# Patient Record
Sex: Male | Born: 1985 | Race: White | Hispanic: No | Marital: Married | Smoking: Never smoker
Health system: Southern US, Community
[De-identification: ages and names within clinical notes are randomized; demographics above are authoritative.]

---
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
## 2005-02-09 ENCOUNTER — Emergency Department (HOSPITAL_COMMUNITY): Admission: EM | Admit: 2005-02-09 | Discharge: 2005-02-09 | Payer: Self-pay | Admitting: Emergency Medicine

## 2007-08-15 ENCOUNTER — Emergency Department (HOSPITAL_COMMUNITY): Admission: EM | Admit: 2007-08-15 | Discharge: 2007-08-16 | Payer: Self-pay | Admitting: Emergency Medicine

## 2008-07-25 ENCOUNTER — Emergency Department (HOSPITAL_COMMUNITY): Admission: EM | Admit: 2008-07-25 | Discharge: 2008-07-26 | Payer: Self-pay | Admitting: Emergency Medicine

## 2010-01-27 ENCOUNTER — Emergency Department (HOSPITAL_COMMUNITY)
Admission: EM | Admit: 2010-01-27 | Discharge: 2010-01-27 | Payer: Self-pay | Source: Home / Self Care | Admitting: Emergency Medicine

## 2010-04-21 LAB — DIFFERENTIAL
Basophils Relative: 1 % (ref 0–1)
Lymphs Abs: 1.5 10*3/uL (ref 0.7–4.0)
Monocytes Relative: 8 % (ref 3–12)
Neutro Abs: 6.6 10*3/uL (ref 1.7–7.7)

## 2010-04-21 LAB — CBC
HCT: 44.9 % (ref 39.0–52.0)
Hemoglobin: 15.4 g/dL (ref 13.0–17.0)
MCV: 87.3 fL (ref 78.0–100.0)
Platelets: 244 10*3/uL (ref 150–400)
RDW: 12.7 % (ref 11.5–15.5)

## 2010-04-21 LAB — POCT CARDIAC MARKERS
CKMB, poc: <1 ng/mL — ABNORMAL LOW (ref 1.0–8.0)
Myoglobin, poc: 63.2 ng/mL (ref 12–200)
Troponin i, poc: <0.05 ng/mL (ref 0.00–0.09)

## 2012-07-10 ENCOUNTER — Emergency Department (HOSPITAL_COMMUNITY)
Admission: EM | Admit: 2012-07-10 | Discharge: 2012-07-10 | Disposition: A | Discharge status: Home / Self Care | Payer: Self-pay | Attending: Emergency Medicine | Admitting: Emergency Medicine

## 2012-07-10 ENCOUNTER — Encounter (HOSPITAL_COMMUNITY): Payer: Self-pay | Admitting: *Deleted

## 2012-07-10 DIAGNOSIS — Z4802 Encounter for removal of sutures: Secondary | ICD-10-CM | POA: Insufficient documentation

## 2012-07-10 NOTE — ED Notes (Signed)
Pt presents to er for removal of stitches to left ear that was placed last sunday

## 2012-07-10 NOTE — ED Notes (Signed)
7 sutures removed from left ear. Pt tolerated well. Edges well approximated. No drainage noted from site. EDP aware.

## 2012-07-12 NOTE — ED Provider Notes (Signed)
   History    CSN: [id number] Arrival date & time 07/10/12  1229  First MD Initiated Contact with Patient 07/10/12 1310     Chief Complaint  Patient presents with  . Suture / Staple Removal   (Consider location/radiation/quality/duration/timing/severity/associated sxs/prior Treatment) HPI Comments: Jordan Duarte is a 27 y.o. male who presents to the Emergency Department requesting suture removal.  States he had sutures placed to the left external ear 7 days prior.  He denies pain , swelling or drainage.  Sutures were placed at another facility.    The history is provided by the patient.   History reviewed. No pertinent past medical history. History reviewed. No pertinent past surgical history. No family history on file. History  Substance Use Topics  . Smoking status: Not on file  . Smokeless tobacco: Not on file  . Alcohol Use: Not on file    Review of Systems  Constitutional: Negative for fever and chills.  Musculoskeletal: Negative for back pain, joint swelling and arthralgias.  Skin: Positive for wound.       Laceration   Neurological: Negative for dizziness, weakness and numbness.  Hematological: Does not bruise/bleed easily.  All other systems reviewed and are negative.    Allergies  Review of patient's allergies indicates no known allergies.  Home Medications   Current Outpatient Rx  Name  Route  Sig  Dispense  Refill  . acetaminophen (TYLENOL) 500 MG tablet   Oral   Take 1,000 mg by mouth every 6 (six) hours as needed for pain.         . cephALEXin (KEFLEX) 500 MG capsule   Oral   Take 500 mg by mouth 4 (four) times daily. Started 07/04/2012.         Marland Kitchen HYDROcodone-acetaminophen (NORCO/VICODIN) 5-325 MG per tablet   Oral   Take 1 tablet by mouth every 6 (six) hours as needed for pain.          BP 125/71  Pulse 79  Temp(Src) 98.6 F (37 C) (Oral)  Resp 16  Ht 5\' 9"  (1.753 m)  Wt 200 lb (90.719 kg)  BMI 29.52 kg/m2 Physical Exam  Nursing  note and vitals reviewed. Constitutional: He is oriented to person, place, and time. He appears well-developed and well-nourished. No distress.  HENT:  Head: Normocephalic and atraumatic.  Ears:  Sutures placed to the external left ear.  Suture line intact.  Appears to be healing well.    Neck: Normal range of motion. Neck supple.  Cardiovascular: Normal rate, regular rhythm and normal heart sounds.   Pulmonary/Chest: Breath sounds normal. No respiratory distress.  Musculoskeletal: He exhibits no edema and no tenderness.  Lymphadenopathy:    He has no cervical adenopathy.  Neurological: He is alert and oriented to person, place, and time. He exhibits normal muscle tone. Coordination normal.  Skin: Skin is warm. Laceration noted.  See HENT exam    ED Course  Procedures (including critical care time) Labs Reviewed - No data to display No results found. 1. Visit for suture removal     MDM     Sutures to left external ear.   No edema or drainage.  Appears to healing well.     Sutures removed by nursing.  Suture line remains intact after removal of the sutures.    Drexel Ivey L. Trisha Mangle, PA-C 07/12/12 1733

## 2012-07-15 NOTE — ED Provider Notes (Signed)
Medical screening examination/treatment/procedure(s) were performed by non-physician practitioner and as supervising physician I was immediately available for consultation/collaboration.   Able Malloy L Summer Mccolgan, MD 07/15/12 1344 

## 2012-07-17 IMAGING — CT CT HEAD W/O CM
1 series · 16 of 30 positions shown, 20 images · non-contrast
Comparison: None.

CLINICAL DATA: Headache, weakness, nausea

CT HEAD WITHOUT CONTRAST
TECHNIQUE: Contiguous axial images were obtained from the base of
the skull through the vertex without contrast.

[Series 2: headseq 4.8 h37s · axial · 0.43mm/px · z∈[+133,+290]mm · 16 of 36 slices shown, 20 images]
[im 2/36  brain]
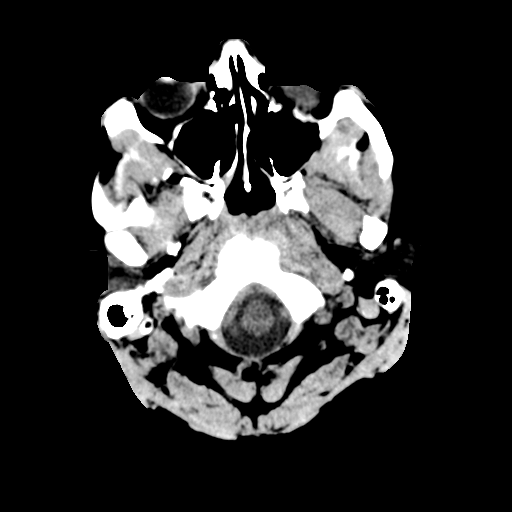
[im 2/36  bone]
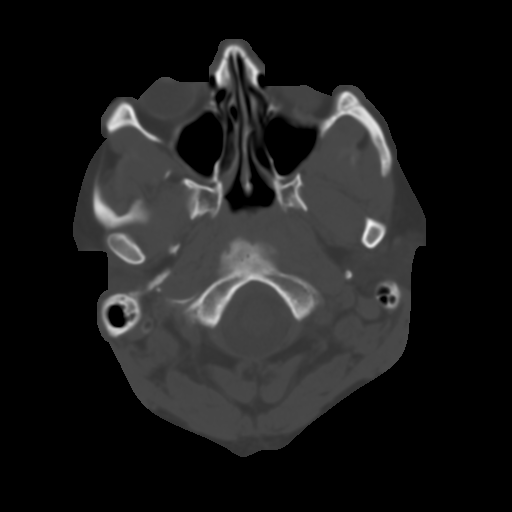
[im 4/36  brain]
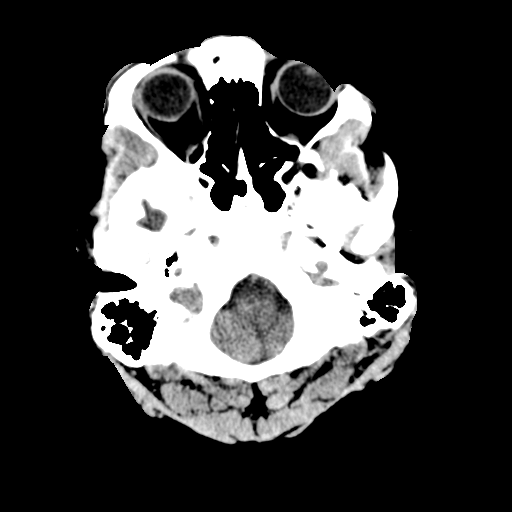
[im 7/36  brain]
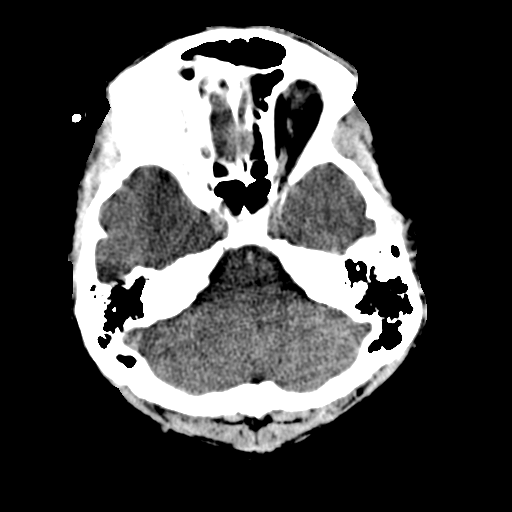
[im 9/36  brain]
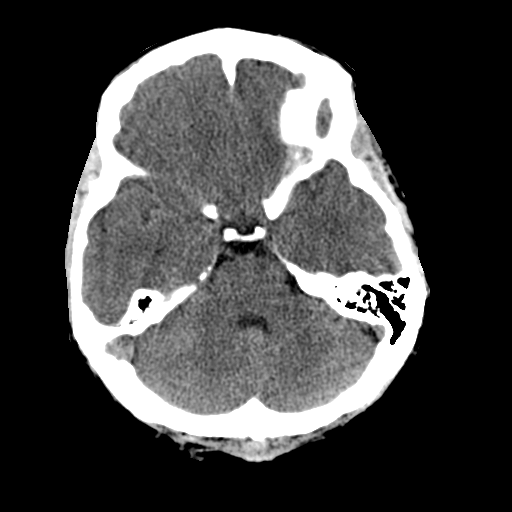
[im 10/36  brain]
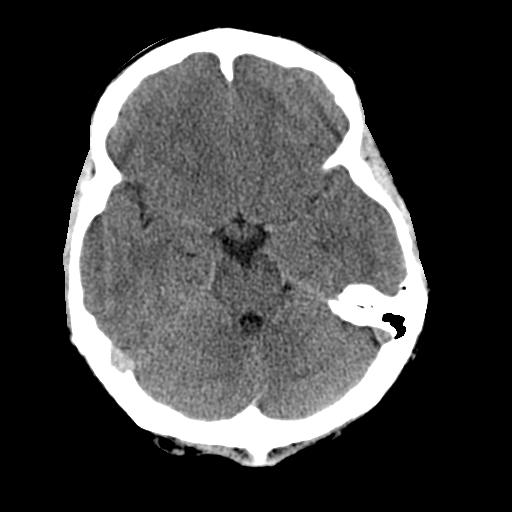
[im 10/36  bone]
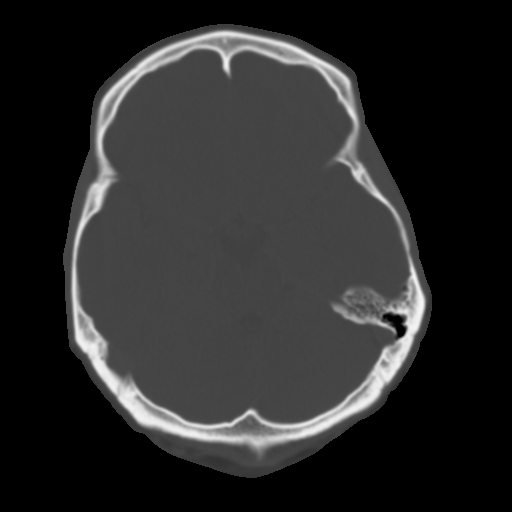
[im 13/36  brain]
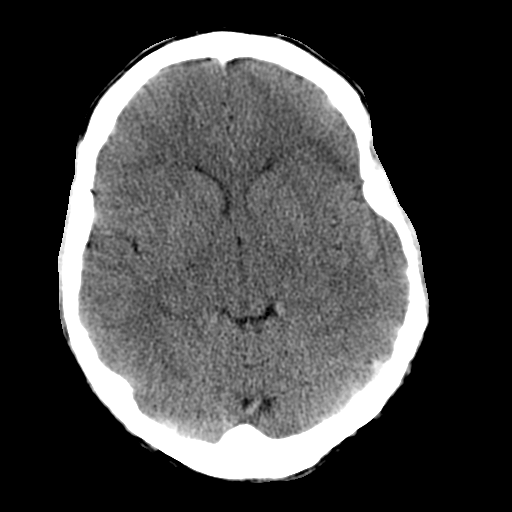
[im 15/36  brain]
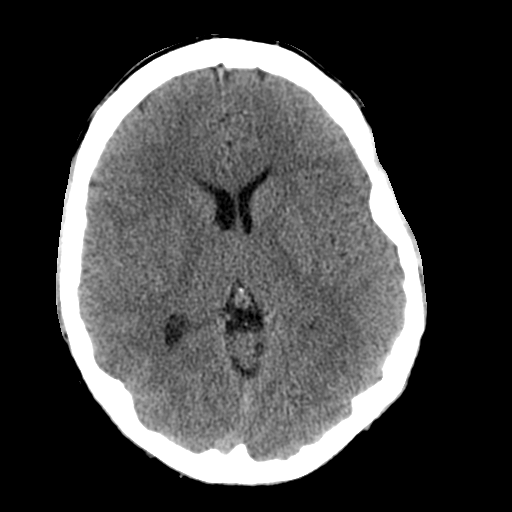
[im 17/36  brain]
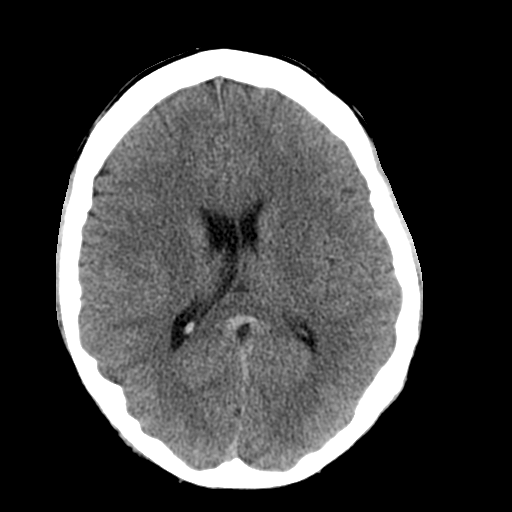
[im 19/36  brain]
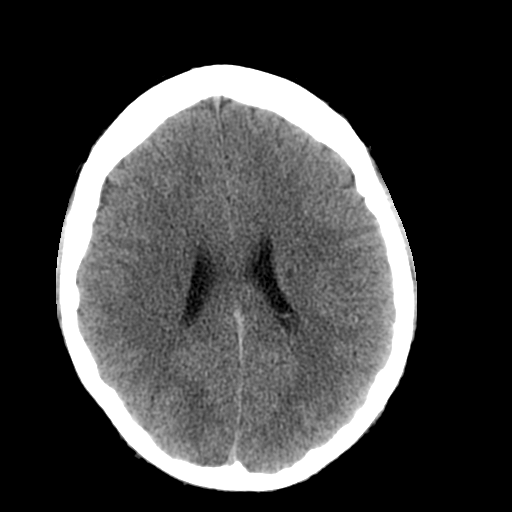
[im 19/36  bone]
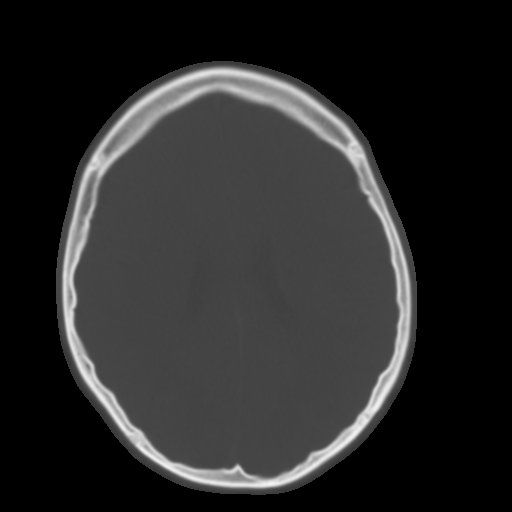
[im 21/36  brain]
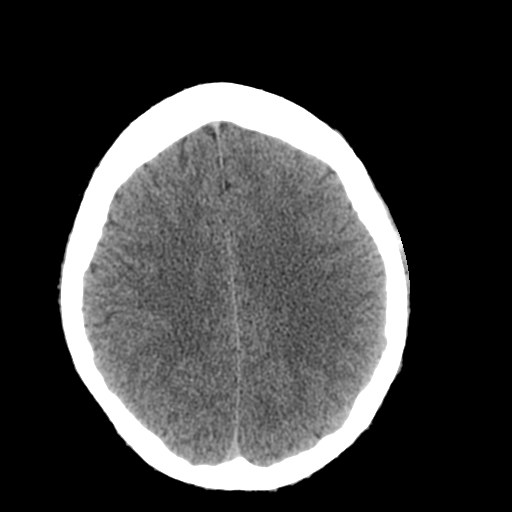
[im 23/36  brain]
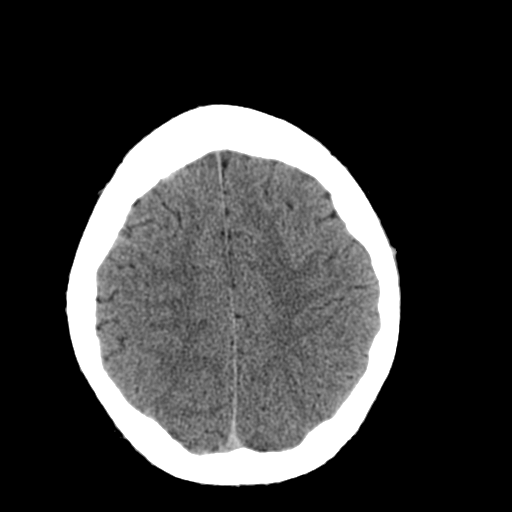
[im 26/36  brain]
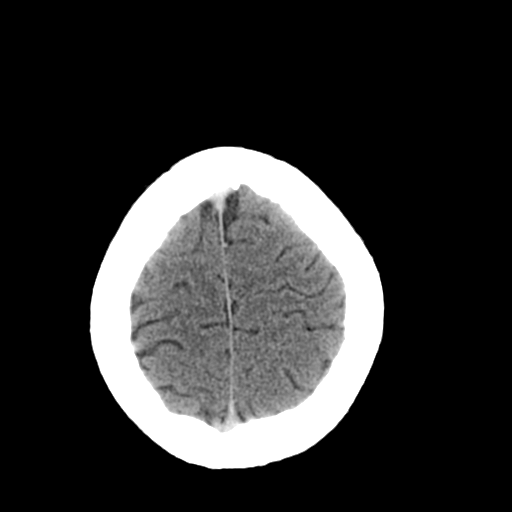
[im 27/36  brain]
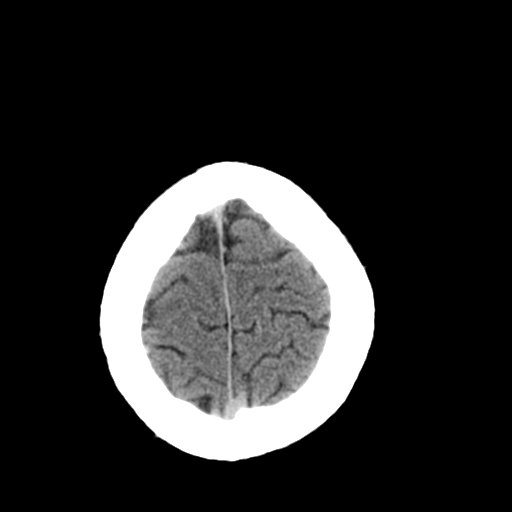
[im 27/36  bone]
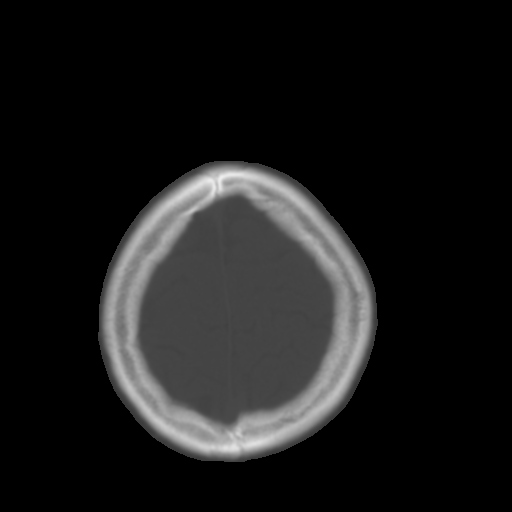
[im 29/36  brain]
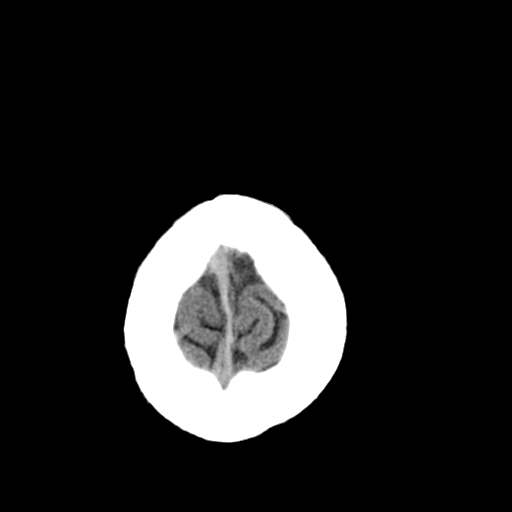
[im 32/36  brain]
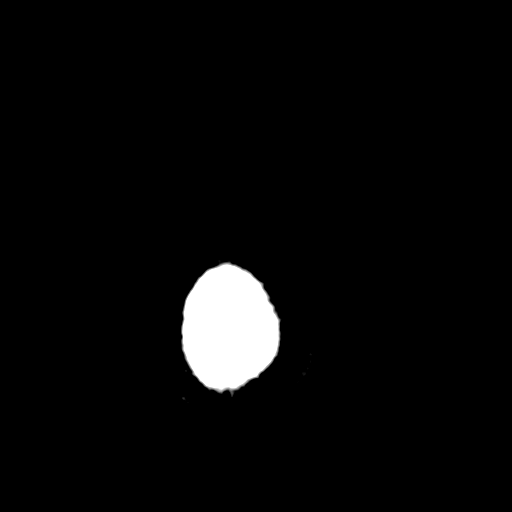
[im 34/36  brain]
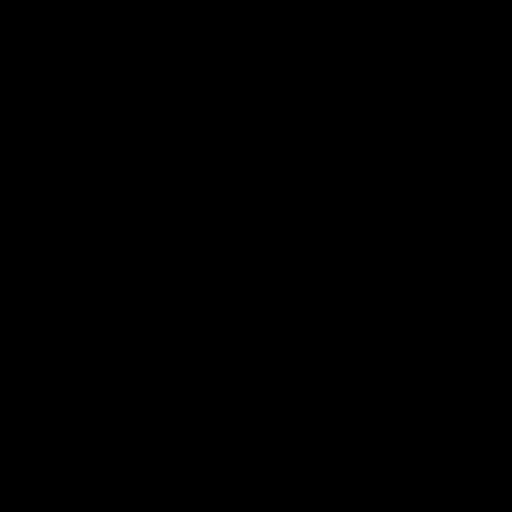

[16 of 30 positions shown; findings below may reference images not displayed]

FINDINGS: There is no evidence of acute intracranial hemorrhage,
brain edema, mass lesion, acute infarction,   mass effect, or
midline shift. Acute infarct may be inapparent on noncontrast CT.
No other intra-axial abnormalities are seen, and the ventricles and
sulci are within normal limits in size and symmetry.   No abnormal
extra-axial fluid collections or masses are identified.  No
significant calvarial abnormality.
IMPRESSION: 1. Negative for bleed or other acute intracranial process.

## 2018-08-26 ENCOUNTER — Emergency Department (HOSPITAL_COMMUNITY)
Admission: EM | Admit: 2018-08-26 | Discharge: 2018-08-26 | Disposition: A | Discharge status: Home / Self Care | Payer: Self-pay | Attending: Emergency Medicine | Admitting: Emergency Medicine

## 2018-08-26 ENCOUNTER — Encounter (HOSPITAL_COMMUNITY): Payer: Self-pay

## 2018-08-26 ENCOUNTER — Other Ambulatory Visit: Payer: Self-pay

## 2018-08-26 DIAGNOSIS — T63461A Toxic effect of venom of wasps, accidental (unintentional), initial encounter: Secondary | ICD-10-CM | POA: Insufficient documentation

## 2018-08-26 DIAGNOSIS — Z79899 Other long term (current) drug therapy: Secondary | ICD-10-CM | POA: Insufficient documentation

## 2018-08-26 MED ORDER — IBUPROFEN 800 MG PO TABS: 800.0000 mg | ORAL_TABLET | Freq: Three times a day (TID) | ORAL | 0 refills | Status: DC | PRN

## 2018-08-26 MED ORDER — FAMOTIDINE 20 MG PO TABS
20.0000 mg | ORAL_TABLET | Freq: Once | ORAL | Status: AC
  Administered 2018-08-26: 11:00:00 20 mg via ORAL
  Filled 2018-08-26: qty 1

## 2018-08-26 MED ORDER — IBUPROFEN 800 MG PO TABS
800.0000 mg | ORAL_TABLET | Freq: Once | ORAL | Status: AC
  Administered 2018-08-26: 11:00:00 800 mg via ORAL
  Filled 2018-08-26: qty 1

## 2018-08-26 MED ORDER — FAMOTIDINE 20 MG PO TABS: 20.0000 mg | ORAL_TABLET | Freq: Two times a day (BID) | ORAL | 0 refills | Status: DC

## 2018-08-26 NOTE — Discharge Instructions (Addendum)
Continue taking your Benadryl every 6 hours as discussed.  Add Pepcid twice daily and ibuprofen as prescribed 3 times daily, make sure you take this with a meal or snack.  Ice and elevation will also help to reduce this localized swelling.

## 2018-08-26 NOTE — ED Provider Notes (Signed)
[facility] EMERGENCY DEPARTMENT Provider Note   CSN: [id number] Arrival date & time: 08/26/18  [id number]    History   Chief Complaint Chief Complaint  Patient presents with  . Insect Bite    HPI Jordan Duarte is a 33 y.o. male with a distant history of asthma, no history of bee allergy, presenting with pain, swelling and itching of his left forearm since being stung multiple times by yellow jackets yesterday at 1 pm while trimming a yard (works in [employer]). He also reports several stings to his scalp and face but these sites have improved.  He denies sob, cough, wheezing, mouth, throat swelling.  He has taken benadryl, last dose about an hour before arrival with improvement of the itching sx.       The history is provided by the patient.    History reviewed. No pertinent past medical history.  There are no active problems to display for this patient.   History reviewed. No pertinent surgical history.      Home Medications    Prior to Admission medications   Medication Sig Start Date End Date Taking? Authorizing Provider  acetaminophen (TYLENOL) 500 MG tablet Take 1,000 mg by mouth every 6 (six) hours as needed for pain.   Yes [provider]  diphenhydrAMINE (BENADRYL) 25 MG tablet Take 25 mg by mouth every 6 (six) hours as needed for allergies.   Yes [provider]  cephALEXin (KEFLEX) 500 MG capsule Take 500 mg by mouth 4 (four) times daily. Started 07/04/2012.    [provider]  famotidine (PEPCID) 20 MG tablet Take 1 tablet (20 mg total) by mouth 2 (two) times daily. 08/26/18   Doreena Maulden, Raynelle FanningJulie, PA-C  ibuprofen (ADVIL) 800 MG tablet Take 1 tablet (800 mg total) by mouth every 8 (eight) hours as needed for moderate pain (and swelling). 08/26/18   Burgess AmorIdol, Carmie Lanpher, PA-C    Family History No family history on file.  Social History Social History   Tobacco Use  . Smoking status: Never Smoker  . Smokeless tobacco: Never Used  Substance Use Topics  .  Alcohol use: Not on file  . Drug use: Not on file     Allergies   Patient has no known allergies.   Review of Systems Review of Systems  Constitutional: Negative for chills and fever.  HENT: Negative for facial swelling, trouble swallowing and voice change.   Respiratory: Negative for shortness of breath and wheezing.   Gastrointestinal: Negative for abdominal pain and nausea.  Musculoskeletal: Negative.   Skin:       Negative except as mentioned in HPI.   Neurological: Negative for weakness and numbness.     Physical Exam Updated Vital Signs BP 120/82 (BP Location: Right Arm)   Temp 98.6 F (37 C)   Resp 15   Ht 5\' 9"  (1.753 m)   Wt 111.1 kg   SpO2 96%   BMI 36.18 kg/m   Physical Exam Constitutional:      General: He is not in acute distress.    Appearance: He is well-developed.  HENT:     Head: Normocephalic.  Neck:     Musculoskeletal: Neck supple.  Cardiovascular:     Rate and Rhythm: Normal rate.     Pulses:          Radial pulses are 2+ on the right side and 2+ on the left side.  Pulmonary:     Effort: Pulmonary effort is normal.  Breath sounds: No wheezing.  Musculoskeletal: Normal range of motion.  Skin:    Findings: Erythema present. No rash.     Comments: Mild erythema left dorsal hand which is nontender, soft edema present, minimally involving left dorsal forearm.  No rash or lesions.  No identified face or scalp stings.      ED Treatments / Results  Labs (all labs ordered are listed, but only abnormal results are displayed) Labs Reviewed - No data to display  EKG None  Radiology No results found.  Procedures Procedures (including critical care time)  Medications Ordered in ED Medications  famotidine (PEPCID) tablet 20 mg (20 mg Oral Given 08/26/18 1044)  ibuprofen (ADVIL) tablet 800 mg (800 mg Oral Given 08/26/18 1044)     Initial Impression / Assessment and Plan / ED Course  I have reviewed the triage vital signs and the  nursing notes.  Pertinent labs & imaging results that were available during my care of the patient were reviewed by me and considered in my medical decision making (see chart for details).        Patient with localized reaction to multiple yellowjacket stings.  Injury occurred almost 24 hours ago.  He has no constitutional symptoms to suggest anaphylaxis.  He has taken Benadryl, will add ibuprofen and Pepcid for additional swelling and pain relief, additional histamine treatment.  Ice and elevation also recommended.  PRN follow-up anticipated.  Final Clinical Impressions(s) / ED Diagnoses   Final diagnoses:  Yellow jacket sting, accidental or unintentional, initial encounter    ED Discharge Orders         Ordered    ibuprofen (ADVIL) 800 MG tablet  Every 8 hours PRN     08/26/18 1052    famotidine (PEPCID) 20 MG tablet  2 times daily     08/26/18 1052           Evalee Jefferson, PA-C 08/26/18 1054    Lucrezia Starch, MD 08/27/18 1200

## 2018-08-26 NOTE — ED Triage Notes (Signed)
Pt had a bee sting yesterday around 12 pm. Took Benadryl q4 since yesterday. Swelling to left hand. NAD.

## 2019-05-22 ENCOUNTER — Emergency Department (HOSPITAL_COMMUNITY)
Admission: EM | Admit: 2019-05-22 | Discharge: 2019-05-22 | Disposition: A | Discharge status: Home / Self Care | Payer: Self-pay | Attending: Emergency Medicine | Admitting: Emergency Medicine

## 2019-05-22 ENCOUNTER — Encounter (HOSPITAL_COMMUNITY): Payer: Self-pay | Admitting: Emergency Medicine

## 2019-05-22 ENCOUNTER — Other Ambulatory Visit: Payer: Self-pay

## 2019-05-22 ENCOUNTER — Emergency Department (HOSPITAL_COMMUNITY): Payer: Self-pay

## 2019-05-22 DIAGNOSIS — W231XXA Caught, crushed, jammed, or pinched between stationary objects, initial encounter: Secondary | ICD-10-CM | POA: Insufficient documentation

## 2019-05-22 DIAGNOSIS — Y9389 Activity, other specified: Secondary | ICD-10-CM | POA: Insufficient documentation

## 2019-05-22 DIAGNOSIS — Y990 Civilian activity done for income or pay: Secondary | ICD-10-CM | POA: Insufficient documentation

## 2019-05-22 DIAGNOSIS — Y929 Unspecified place or not applicable: Secondary | ICD-10-CM | POA: Insufficient documentation

## 2019-05-22 DIAGNOSIS — Z79899 Other long term (current) drug therapy: Secondary | ICD-10-CM | POA: Insufficient documentation

## 2019-05-22 DIAGNOSIS — Z23 Encounter for immunization: Secondary | ICD-10-CM | POA: Insufficient documentation

## 2019-05-22 DIAGNOSIS — S62631B Displaced fracture of distal phalanx of left index finger, initial encounter for open fracture: Secondary | ICD-10-CM | POA: Insufficient documentation

## 2019-05-22 MED ORDER — IBUPROFEN 600 MG PO TABS
600.0000 mg | ORAL_TABLET | Freq: Four times a day (QID) | ORAL | 0 refills | Status: DC | PRN
Start: 2019-05-22 — End: 2023-03-11

## 2019-05-22 MED ORDER — LIDOCAINE HCL (PF) 1 % IJ SOLN
5.0000 mL | Freq: Once | INTRAMUSCULAR | Status: AC
  Administered 2019-05-22: 5 mL
  Filled 2019-05-22: qty 30

## 2019-05-22 MED ORDER — TETANUS-DIPHTH-ACELL PERTUSSIS 5-2.5-18.5 LF-MCG/0.5 IM SUSP
0.5000 mL | Freq: Once | INTRAMUSCULAR | Status: AC
  Administered 2019-05-22: 0.5 mL via INTRAMUSCULAR
  Filled 2019-05-22: qty 0.5

## 2019-05-22 MED ORDER — CEPHALEXIN 500 MG PO CAPS
500.0000 mg | ORAL_CAPSULE | Freq: Four times a day (QID) | ORAL | 0 refills | Status: DC
Start: 2019-05-22 — End: 2019-05-26

## 2019-05-22 MED ORDER — CEPHALEXIN 500 MG PO CAPS
500.0000 mg | ORAL_CAPSULE | Freq: Once | ORAL | Status: AC
  Administered 2019-05-22: 500 mg via ORAL
  Filled 2019-05-22: qty 1

## 2019-05-22 MED ORDER — BUPIVACAINE HCL (PF) 0.5 % IJ SOLN
10.0000 mL | Freq: Once | INTRAMUSCULAR | Status: AC
  Administered 2019-05-22: 22:00:00 10 mL
  Filled 2019-05-22: qty 30

## 2019-05-22 MED ORDER — HYDROCODONE-ACETAMINOPHEN 5-325 MG PO TABS: 1.0000 | ORAL_TABLET | Freq: Four times a day (QID) | ORAL | 0 refills | Status: DC | PRN

## 2019-05-22 NOTE — Discharge Instructions (Addendum)
Fracture care There is evidence of a fracture on the x-ray. Pain:  Antiinflammatory medications: Take 600 mg of ibuprofen every 6 hours or 440 mg (over the counter dose) to 500 mg (prescription dose) of naproxen every 12 hours for the next 3 days. After this time, these medications may be used as needed for pain. Take these medications with food to avoid upset stomach. Choose only one of these medications, do not take them together. Acetaminophen (generic for Tylenol): Should you continue to have additional pain while taking the ibuprofen or naproxen, you may add in acetaminophen as needed. Your daily total maximum amount of acetaminophen from all sources should be limited to 4000mg /day for persons without liver problems, or 2000mg /day for those with liver problems. Vicodin: May take Vicodin (hydrocodone-acetaminophen) as needed for severe pain.   Do not drive or perform other dangerous activities while taking this medication as it can cause drowsiness as well as changes in reaction time and judgement.   Please note that each pill of Vicodin contains 325 mg of acetaminophen (generic for Tylenol) and the above dosage limits apply. Ice: May apply ice to the injured area for no more than 15 minutes at a time to reduce swelling and pain. Elevation: Keep the extremity elevated whenever possible to reduce swelling and pain. Bandage: Keep the bandage in place until seen by the orthopedic specialist. Splint: Keep the splint clean and dry.  Protect it from water during bathing.  If the splint gets wet, you will need to have it reapplied.  Do not leave a wet splint against the skin as this can cause skin breakdown.  Call the orthopedist office or come to the ED for splint replacement, if needed.  Please take all of your antibiotics until finished!   You may develop abdominal discomfort or diarrhea from the antibiotic.  You may help offset this with probiotics which you can buy or get in yogurt. Do not eat or take  the probiotics until 2 hours after your antibiotic.  Follow-up: Follow-up with the orthopedic specialist for any further management of this issue.  Call the number provided to set up an appointment. Return: Return to the emergency department for severely increased pain, numbness, blanching of the skin, or any other major concerns.

## 2019-05-22 NOTE — ED Triage Notes (Signed)
Pt states left index finger crushed by bucket on skid steer.

## 2019-05-22 NOTE — ED Provider Notes (Signed)
[facility] EMERGENCY DEPARTMENT Provider Note   CSN: [id number] Arrival date & time: 05/22/19  2024     History Chief Complaint  Patient presents with  . Hand Pain    Jordan Duarte is a 34 y.o. male.  HPI      Jordan Duarte is a 34 y.o. male, patient with no pertinent past medical history, presenting to the ED with injury to left index finger that occurred shortly prior to arrival. He states he was attaching a bucket attachment to a bobcat, the operator of the bobcat let the hydraulics down prematurely and the finger was caught in between 2 pieces of metal. Unknown last tetanus update. Denies numbness, other injuries.  History reviewed. No pertinent past medical history.  There are no problems to display for this patient.   History reviewed. No pertinent surgical history.     No family history on file.  Social History   Tobacco Use  . Smoking status: Never Smoker  . Smokeless tobacco: Never Used  Substance Use Topics  . Alcohol use: Never  . Drug use: Never    Home Medications Prior to Admission medications   Medication Sig Start Date End Date Taking? Authorizing Provider  acetaminophen (TYLENOL) 500 MG tablet Take 500-1,000 mg by mouth every 6 (six) hours as needed for mild pain or moderate pain.    Yes [provider]  UNKNOWN TO PATIENT Take 1 capsule by mouth daily. ANTIBIOTIC-NAME UNKNOWN   Yes [provider]  cephALEXin (KEFLEX) 500 MG capsule Take 1 capsule (500 mg total) by mouth 4 (four) times daily. 05/22/19   Bentley Fissel C, PA-C  HYDROcodone-acetaminophen (NORCO/VICODIN) 5-325 MG tablet Take 1-2 tablets by mouth every 6 (six) hours as needed for severe pain. 05/22/19   Michaline Kindig C, PA-C  ibuprofen (ADVIL) 600 MG tablet Take 1 tablet (600 mg total) by mouth every 6 (six) hours as needed. 05/22/19   Winifred Balogh C, PA-C    Allergies    Patient has no known allergies.  Review of Systems   Review of Systems  Skin: Positive for  wound.  Neurological: Negative for numbness.    Physical Exam Updated Vital Signs BP (!) 154/106   Pulse 68   Temp 98.4 F (36.9 C)   Resp 18   Ht 5\' 9"  (1.753 m)   Wt 90.7 kg   SpO2 100%   BMI 29.53 kg/m   Physical Exam Vitals and nursing note reviewed.  Constitutional:      General: He is not in acute distress.    Appearance: He is well-developed. He is not diaphoretic.  HENT:     Head: Normocephalic and atraumatic.  Eyes:     Conjunctiva/sclera: Conjunctivae normal.  Cardiovascular:     Rate and Rhythm: Normal rate and regular rhythm.  Pulmonary:     Effort: Pulmonary effort is normal.  Musculoskeletal:     Cervical back: Neck supple.     Comments: Left index finger with flexion deformity at the DIP joint. The skin proximal to the nail is pushed back, but the nail that is visible seems to still be intact and firmly attached. There is a small area of hemorrhage from a split in the skin on the radial side of the nail. There is perhaps a 1 cm laceration to the ulnar side of the finger with jagged wound edges.  Skin:    General: Skin is warm and dry.     Capillary Refill: Capillary refill takes less  than 2 seconds.     Coloration: Skin is not pale.  Neurological:     Mental Status: He is alert.     Comments: Sensation light touch grossly intact throughout the left index finger. Motor function intact at the PIP and MCP joints.  He is able to flex at the DIP joint, but not extend at this joint.  Psychiatric:        Behavior: Behavior normal.                   ED Results / Procedures / Treatments   Labs (all labs ordered are listed, but only abnormal results are displayed) Labs Reviewed - No data to display  EKG None  Radiology DG Hand Complete Left  Result Date: 05/22/2019 CLINICAL DATA:  Crush injury, deformity left second digit EXAM: LEFT HAND - COMPLETE 3+ VIEW COMPARISON:  None. FINDINGS: Frontal, oblique, lateral views of the left hand  demonstrate and open fracture at the base of the second distal phalanx. No definite intra-articular extension. No other acute displaced fractures. Joint spaces are well preserved. Soft tissue swelling second digit. IMPRESSION: 1. Open displaced fracture at the base of the second distal phalanx. Electronically Signed   By: Sharlet Salina M.D.   On: 05/22/2019 21:30    Procedures .Nerve Block  Date/Time: 05/22/2019 9:48 PM Performed by: Anselm Pancoast, PA-C Authorized by: Anselm Pancoast, PA-C   Consent:    Consent obtained:  Verbal   Consent given by:  Patient   Risks discussed:  Infection, swelling, unsuccessful block and pain Indications:    Indications:  Pain relief and procedural anesthesia Location:    Body area:  Upper extremity   Upper extremity nerve blocked: Index finger, digital.   Laterality:  Left Pre-procedure details:    Skin preparation:  2% chlorhexidine Procedure details (see MAR for exact dosages):    Block needle gauge:  25 G   Anesthetic injected:  Bupivacaine 0.5% w/o epi and lidocaine 1% w/o epi   Injection procedure:  Anatomic landmarks identified, anatomic landmarks palpated, incremental injection, introduced needle and negative aspiration for blood Post-procedure details:    Outcome:  Anesthesia achieved   Patient tolerance of procedure:  Tolerated well, no immediate complications .Marland KitchenLaceration Repair  Date/Time: 05/22/2019 10:50 PM Performed by: Anselm Pancoast, PA-C Authorized by: Anselm Pancoast, PA-C   Consent:    Consent obtained:  Verbal   Consent given by:  Patient   Risks discussed:  Infection, need for additional repair, nerve damage, pain, poor cosmetic result, poor wound healing, retained foreign body, tendon damage and vascular damage Anesthesia (see MAR for exact dosages):    Anesthesia method:  Nerve block   Block location:  Digital   Block needle gauge:  25 G   Block anesthetic:  Bupivacaine 0.5% w/o epi and lidocaine 1% w/o epi   Block injection  procedure:  Anatomic landmarks identified, anatomic landmarks palpated, introduced needle, negative aspiration for blood and incremental injection   Block outcome:  Anesthesia achieved Laceration details:    Location:  Finger   Finger location:  L index finger   Length (cm):  1 Repair type:    Repair type:  Intermediate Pre-procedure details:    Preparation:  Patient was prepped and draped in usual sterile fashion and imaging obtained to evaluate for foreign bodies Exploration:    Contaminated: yes   Treatment:    Area cleansed with:  Betadine and saline   Amount of cleaning:  Extensive   Irrigation solution:  Sterile saline   Irrigation volume:  1000 cc   Irrigation method:  Syringe Skin repair:    Repair method:  Sutures   Suture size:  5-0   Suture material:  Nylon   Suture technique:  Simple interrupted   Number of sutures:  2 Approximation:    Approximation:  Loose Post-procedure details:    Dressing:  Sterile dressing and splint for protection   Patient tolerance of procedure:  Tolerated well, no immediate complications Comments:     3 5-0 ethilon .Marland KitchenLaceration Repair  Date/Time: 05/22/2019 11:10 PM Performed by: Anselm Pancoast, PA-C Authorized by: Anselm Pancoast, PA-C   Consent:    Consent obtained:  Verbal   Consent given by:  Patient   Risks discussed:  Infection, need for additional repair, nerve damage, pain, poor cosmetic result, poor wound healing, retained foreign body, tendon damage and vascular damage Anesthesia (see MAR for exact dosages):    Anesthesia method:  Nerve block   Block location:  Digital   Block needle gauge:  25 G   Block anesthetic:  Bupivacaine 0.5% w/o epi and lidocaine 1% w/o epi   Block injection procedure:  Anatomic landmarks identified, anatomic landmarks palpated, introduced needle, negative aspiration for blood and incremental injection   Block outcome:  Anesthesia achieved Laceration details:    Location:  Finger   Finger location:   L index finger   Length (cm):  0.3 Repair type:    Repair type:  Intermediate Pre-procedure details:    Preparation:  Patient was prepped and draped in usual sterile fashion and imaging obtained to evaluate for foreign bodies Exploration:    Contaminated: yes   Treatment:    Area cleansed with:  Betadine and saline   Amount of cleaning:  Extensive   Irrigation solution:  Sterile saline   Irrigation volume:  1000 cc   Irrigation method:  Syringe Skin repair:    Repair method:  Sutures   Suture size:  5-0   Suture material:  Nylon   Suture technique:  Simple interrupted   Number of sutures:  1 Approximation:    Approximation:  Loose Post-procedure details:    Dressing:  Sterile dressing and splint for protection   Patient tolerance of procedure:  Tolerated well, no immediate complications   (including critical care time)  Medications Ordered in ED Medications  bupivacaine (MARCAINE) 0.5 % injection 10 mL (10 mLs Infiltration Given by Other 05/22/19 2147)  Tdap (BOOSTRIX) injection 0.5 mL (0.5 mLs Intramuscular Given 05/22/19 2144)  lidocaine (PF) (XYLOCAINE) 1 % injection 5 mL (5 mLs Infiltration Given by Other 05/22/19 2147)  cephALEXin (KEFLEX) capsule 500 mg (500 mg Oral Given 05/22/19 2235)    ED Course  I have reviewed the triage vital signs and the nursing notes.  Pertinent labs & imaging results that were available during my care of the patient were reviewed by me and considered in my medical decision making (see chart for details).  Clinical Course as of May 22 29  Mon May 22, 2019  [id number] 34 year old male with crush injury to left index finger.  He has an ulceration at the DIP and on x-ray he has a fracture there.  Will review with hand consultant for recommendations.   [MB]  2221 Spoke with Dr. Romeo Apple, on call for hand surgery.  We discussed the patient's mechanism of injury and physical exam findings.  He reviewed the pictures in the chart as well as the  x-ray. He agrees with the plan for washout here in the ED, loose suturing, splinting, Keflex, and office follow-up.   [SJ]    Clinical Course User Index [MB] Terrilee Files, MD [SJ] Anselm Pancoast, PA-C   MDM Rules/Calculators/A&P                      Patient presents with injury to his left index finger. No signs of neurovascular compromise across multiple reassessments here in the ED. Open fracture noted on x-ray. Wound was copiously washed, loosely repaired, and splinted.  Antibiotic therapy added.  Follow-up in the office with orthopedics. The patient was given instructions for home care as well as return precautions. Patient voices understanding of these instructions, accepts the plan, and is comfortable with discharge.  I reviewed and interpreted the patient's radiological studies.  Findings and plan of care discussed with Erasmo Score, MD. Dr. Charm Barges personally evaluated and examined this patient.  Final Clinical Impression(s) / ED Diagnoses Final diagnoses:  Open displaced fracture of distal phalanx of left index finger, initial encounter    Rx / DC Orders ED Discharge Orders         Ordered    HYDROcodone-acetaminophen (NORCO/VICODIN) 5-325 MG tablet  Every 6 hours PRN     05/22/19 2323    ibuprofen (ADVIL) 600 MG tablet  Every 6 hours PRN     05/22/19 2323    cephALEXin (KEFLEX) 500 MG capsule  4 times daily     05/22/19 2323           Anselm Pancoast, PA-C 05/23/19 0033    Terrilee Files, MD 05/23/19 1118

## 2019-05-26 ENCOUNTER — Other Ambulatory Visit: Payer: Self-pay

## 2019-05-26 ENCOUNTER — Ambulatory Visit (INDEPENDENT_AMBULATORY_CARE_PROVIDER_SITE_OTHER): Payer: Self-pay | Admitting: Orthopedic Surgery

## 2019-05-26 VITALS — Temp 98.4°F | Ht 69.0 in | Wt 205.0 lb

## 2019-05-26 DIAGNOSIS — S62631A Displaced fracture of distal phalanx of left index finger, initial encounter for closed fracture: Secondary | ICD-10-CM

## 2019-05-26 DIAGNOSIS — S62637A Displaced fracture of distal phalanx of left little finger, initial encounter for closed fracture: Secondary | ICD-10-CM

## 2019-05-26 MED ORDER — CEPHALEXIN 500 MG PO CAPS: 500.0000 mg | ORAL_CAPSULE | Freq: Four times a day (QID) | ORAL | 0 refills | Status: DC

## 2019-05-26 NOTE — Patient Instructions (Signed)
Keep dressing dry and clean   Work note not needed

## 2019-05-26 NOTE — Progress Notes (Signed)
EMERGENCY ROOM FOLLOW UP  NEW PROBLEM/PATIENT   Patient ID: Jordan Duarte, male   DOB: 1985-07-28, 34 y.o.   MRN: [id number]  Emergency room record from (date) 5/10  has been reviewed and this is included by reference and includes the review of systems with the following addition:   Chief Complaint  Patient presents with  . Hand Pain    ER follow up on left index finger, DOI 05-22-19.    HPI Jordan Duarte is a 34 y.o. male.  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  These were treated with irrigation and partial closure in the ER.  He was started on Keflex antibiotics he presents complaining of mild discomfort primarily relieved by ibuprofen with some occasional pain at night he does note lack of extension of the DIP joint   Review of Systems Review of Systems  Neurological: Positive for numbness.  All other systems reviewed and are negative.    has no past medical history on file.   No past surgical history on file.  No family history on file.  Social History Social History   Tobacco Use  . Smoking status: Never Smoker  . Smokeless tobacco: Never Used  Substance Use Topics  . Alcohol use: Never  . Drug use: Never    No Known Allergies  Current Outpatient Medications  Medication Sig Dispense Refill  . cephALEXin (KEFLEX) 500 MG capsule Take 1 capsule (500 mg total) by mouth 4 (four) times daily. 20 capsule 0  . ibuprofen (ADVIL) 600 MG tablet Take 1 tablet (600 mg total) by mouth every 6 (six) hours as needed. 30 tablet 0  . acetaminophen (TYLENOL) 500 MG tablet Take 500-1,000 mg by mouth every 6 (six) hours as needed for mild pain or moderate pain.     Marland Kitchen HYDROcodone-acetaminophen (NORCO/VICODIN) 5-325 MG tablet Take 1-2 tablets by mouth every 6 (six) hours as  needed for severe pain. (Patient not taking: Reported on 05/26/2019) 10 tablet 0  . UNKNOWN TO PATIENT Take 1 capsule by mouth daily. ANTIBIOTIC-NAME UNKNOWN     No current facility-administered medications for this visit.    Physical Exam Temp 98.4 F (36.9 C)   Ht 5\' 9"  (1.753 m)   Wt 205 lb (93 kg)   BMI 30.27 kg/m  Body mass index is 30.27 kg/m.  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  He is on antibiotics to prevent infection he had a good cleanout and irrigation debridement in the emergency room  Plan   Wound care dressing splint in extension recheck x-rays in 2 weeks recheck wound in 2 weeks start active range of motion program in 2 weeks refill medication  Meds ordered this encounter  Medications  . cephALEXin (KEFLEX) 500 MG capsule    Sig: Take 1 capsule (500 mg total) by mouth 4 (four) times daily.    Dispense:  20 capsule    Refill:  0   (This will  be coded as office visits only)   Fuller Canada, MD 05/26/2019 11:07 AM

## 2019-06-02 ENCOUNTER — Ambulatory Visit (INDEPENDENT_AMBULATORY_CARE_PROVIDER_SITE_OTHER): Payer: Self-pay | Admitting: Orthopedic Surgery

## 2019-06-02 ENCOUNTER — Encounter: Payer: Self-pay | Admitting: Orthopedic Surgery

## 2019-06-02 ENCOUNTER — Other Ambulatory Visit: Payer: Self-pay

## 2019-06-02 DIAGNOSIS — S62637A Displaced fracture of distal phalanx of left little finger, initial encounter for closed fracture: Secondary | ICD-10-CM

## 2019-06-02 DIAGNOSIS — S62631D Displaced fracture of distal phalanx of left index finger, subsequent encounter for fracture with routine healing: Secondary | ICD-10-CM

## 2019-06-02 MED ORDER — DOXYCYCLINE HYCLATE 100 MG PO TABS: 100.0000 mg | ORAL_TABLET | Freq: Two times a day (BID) | ORAL | 1 refills | Status: AC

## 2019-06-02 NOTE — Progress Notes (Signed)
Chief Complaint  Patient presents with  . Finger Injury    05/22/19 s/p open fracture of finger left index     11 days status post fracture of the left index finger distal phalanx open fracture irrigation debridement and closure loosely in the ER  Patient seems to be developing infection did not tolerate the Keflex well  Images are in the media section of the record  Recommend changing antibiotics to doxycycline start soaking daily follow-up 1 week to check the wound  Meds ordered this encounter  Medications  . doxycycline (VIBRA-TABS) 100 MG tablet    Sig: Take 1 tablet (100 mg total) by mouth 2 (two) times daily.    Dispense:  28 tablet    Refill:  1   Encounter Diagnosis  Name Primary?  . Closed displaced fracture of distal phalanx of left little finger, initial encounter Yes

## 2019-06-02 NOTE — Patient Instructions (Signed)
Soak 20 min salt and dish detergent   Soak daily

## 2019-06-09 ENCOUNTER — Other Ambulatory Visit: Payer: Self-pay

## 2019-06-09 ENCOUNTER — Ambulatory Visit (INDEPENDENT_AMBULATORY_CARE_PROVIDER_SITE_OTHER): Payer: Self-pay | Admitting: Orthopedic Surgery

## 2019-06-09 DIAGNOSIS — S62637A Displaced fracture of distal phalanx of left little finger, initial encounter for closed fracture: Secondary | ICD-10-CM

## 2019-06-09 DIAGNOSIS — S62631D Displaced fracture of distal phalanx of left index finger, subsequent encounter for fracture with routine healing: Secondary | ICD-10-CM

## 2019-06-09 MED ORDER — HYDROCODONE-ACETAMINOPHEN 5-325 MG PO TABS: 1.0000 | ORAL_TABLET | Freq: Every day | ORAL | 0 refills | Status: AC

## 2019-06-09 NOTE — Patient Instructions (Signed)
Continue soaks and antibiotics

## 2019-06-09 NOTE — Progress Notes (Signed)
Chief Complaint  Patient presents with  . Hand Pain    L/pointer finger some pain especially at night   05-22-19 doi   Jordan Duarte had an open fracture of his left index finger treated in the ER with irrigation loose closure.  Those sutures were removed and it seemed like he was not improving on Keflex we switched him to doxycycline started soaks with warm water dish detergent and Epson salt.  He says his finger feels much better and looks much better see photos  He still has a extensor lag but his erythema has improved tenderness has improved  Recommend continue doxycycline.  Hydrocodone at night for dressing changes  Follow-up in a week Encounter Diagnosis  Name Primary?  . Closed displaced fracture of distal phalanx of left little finger, initial encounter Yes    Meds ordered this encounter  Medications  . HYDROcodone-acetaminophen (NORCO/VICODIN) 5-325 MG tablet    Sig: Take 1 tablet by mouth at bedtime for 7 days.    Dispense:  7 tablet    Refill:  0

## 2019-06-16 ENCOUNTER — Ambulatory Visit: Payer: Self-pay | Admitting: Orthopedic Surgery

## 2019-06-16 ENCOUNTER — Other Ambulatory Visit: Payer: Self-pay

## 2019-06-16 ENCOUNTER — Ambulatory Visit (INDEPENDENT_AMBULATORY_CARE_PROVIDER_SITE_OTHER): Payer: Self-pay | Admitting: Orthopedic Surgery

## 2019-06-16 DIAGNOSIS — S62631D Displaced fracture of distal phalanx of left index finger, subsequent encounter for fracture with routine healing: Secondary | ICD-10-CM

## 2019-06-16 NOTE — Patient Instructions (Signed)
Soak another week  antibiotic continue  Start the bending exercise

## 2019-06-16 NOTE — Progress Notes (Addendum)
Follow up left index finger    34 year old male injured his left small finger and was treated in the ER with open irrigation and wound closure  He is currently on antibiotics to prevent infection is doing soaks with warm water Epson salt and dish detergent  His wounds look good now.  He has an extensor lag.  He has decreased flexion at the IP proximal joint and distal joint.  Recommend active range of motion exercises for the finger he can stop splinting now continue his soaks and antibiotics follow-up in 2 weeks  Encounter Diagnosis  Name Primary?  . Displaced fracture of distal phalanx of left index finger, subsequent encounter for fracture with routine healing Yes

## 2019-07-03 ENCOUNTER — Other Ambulatory Visit: Payer: Self-pay

## 2019-07-03 ENCOUNTER — Encounter: Payer: Self-pay | Admitting: Orthopedic Surgery

## 2019-07-03 ENCOUNTER — Ambulatory Visit (INDEPENDENT_AMBULATORY_CARE_PROVIDER_SITE_OTHER): Payer: Self-pay | Admitting: Orthopedic Surgery

## 2019-07-03 DIAGNOSIS — S62631D Displaced fracture of distal phalanx of left index finger, subsequent encounter for fracture with routine healing: Secondary | ICD-10-CM

## 2019-07-03 NOTE — Progress Notes (Signed)
Chief Complaint  Patient presents with  . Hand Injury    05/22/19 finger injury left index   \  See the pictures in the media section  Patient had a left index fingertip injury fracture wound  He has a little bit of nail deformity a little flexion contracture of the DIP joint little extensor lag there otherwise doing well  He will finish his doxycycline continue his exercises and follow-up with [place] as needed

## 2023-03-11 ENCOUNTER — Other Ambulatory Visit: Payer: Self-pay

## 2023-03-11 ENCOUNTER — Emergency Department (HOSPITAL_COMMUNITY)
Admission: EM | Admit: 2023-03-11 | Discharge: 2023-03-11 | Disposition: A | Discharge status: Home / Self Care | Payer: Self-pay

## 2023-03-11 DIAGNOSIS — M5416 Radiculopathy, lumbar region: Secondary | ICD-10-CM | POA: Insufficient documentation

## 2023-03-11 MED ORDER — KETOROLAC TROMETHAMINE 15 MG/ML IJ SOLN
15.0000 mg | Freq: Once | INTRAMUSCULAR | Status: AC
  Administered 2023-03-11: 15 mg via INTRAMUSCULAR
  Filled 2023-03-11: qty 1

## 2023-03-11 MED ORDER — METHOCARBAMOL 750 MG PO TABS: 750.0000 mg | ORAL_TABLET | Freq: Four times a day (QID) | ORAL | 0 refills | Status: AC | PRN

## 2023-03-11 MED ORDER — KETOROLAC TROMETHAMINE 10 MG PO TABS: 10.0000 mg | ORAL_TABLET | Freq: Four times a day (QID) | ORAL | 0 refills | Status: AC | PRN

## 2023-03-11 NOTE — ED Triage Notes (Signed)
 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 Describes pain as sharp, states pain is worse with movement and bending and turning. Pain while resting states "tolerable" but with movement pain increases to an 8.

## 2023-03-11 NOTE — ED Provider Notes (Signed)
 [place] EMERGENCY DEPARTMENT AT [facility] Provider Note   CSN: [id number] Arrival date & time: 03/11/23  0102     History  Chief Complaint  Patient presents with   Back Pain    Jordan Duarte is a 38 y.o. male.  38 year old male with no reported past medical history presents emergency department today with back pain.  The patient states he has been having back pain on and off now for the past week.  The patient states that he does a lot of bending over for work.  He states that he has been having pain for the past week.  Is mostly in his lower back.  He reports that it is mostly on the left side.  He states that when he moves his legs he has pain in his back but this pain does not radiate.  He denies any weakness, numbness, or tingling.  Denies any bowel or bladder dysfunction and has not had any saddle anesthesia.  The patient denies any fevers.  Denies any history of IV drug abuse.  Denies any recent injuries other than the overuse from work.  He has been taking ibuprofen which has not really been helping at home.   Back Pain      Home Medications Prior to Admission medications   Medication Sig Start Date End Date Taking? Authorizing Provider  ketorolac (TORADOL) 10 MG tablet Take 1 tablet (10 mg total) by mouth every 6 (six) hours as needed. 03/11/23  Yes Durwin Glaze, MD  methocarbamol (ROBAXIN) 750 MG tablet Take 1 tablet (750 mg total) by mouth every 6 (six) hours as needed for muscle spasms. 03/11/23  Yes Durwin Glaze, MD  doxycycline (VIBRA-TABS) 100 MG tablet Take 1 tablet (100 mg total) by mouth 2 (two) times daily. 06/02/19   Vickki Hearing, MD      Allergies    Cephalexin    Review of Systems   Review of Systems  Musculoskeletal:  Positive for back pain.  All other systems reviewed and are negative.   Physical Exam Updated Vital Signs BP (!) 142/94   Pulse 76   Temp 98 F (36.7 C) (Oral)   Resp 17   SpO2 98%  Physical Exam Vitals and  nursing note reviewed.   Gen: NAD Eyes: PERRL, EOMI HEENT: no oropharyngeal swelling Neck: trachea midline Resp: clear to auscultation bilaterally Card: RRR, no murmurs, rubs, or gallops Abd: nontender, nondistended MSK: The patient is tender over the mid lumbar spine the left paraspinal region with no step-offs or deformities, positive straight leg raise on the left Vascular: 2+ radial pulses bilaterally, 2+ DP pulses bilaterally Neuro: Equal strength and sensation throughout the bilateral lower extremities with normal reflexes noted Skin: no rashes   ED Results / Procedures / Treatments   Labs (all labs ordered are listed, but only abnormal results are displayed) Labs Reviewed - No data to display  EKG None  Radiology No results found.  Procedures Procedures    Medications Ordered in ED Medications  ketorolac (TORADOL) 15 MG/ML injection 15 mg (has no administration in time range)    ED Course/ Medical Decision Making/ A&P                                 Medical Decision Making 38 year old male with no reported past medical history presenting to the emergency department today with symptoms consistent with likely lumbar radiculopathy.  The patient is young and denies any recent trauma.  Will hold off on any imaging at this time.  I will treat the patient with NSAIDs and muscle relaxers.  He will be given orthopedic follow-up as needed and will be discharged with return precautions.  He does not have red flag symptoms for cauda equina syndrome or cord compressing lesion at this time.  Risk Prescription drug management.          Final Clinical Impression(s) / ED Diagnoses Final diagnoses:  Lumbar radiculopathy, acute    Rx / DC Orders ED Discharge Orders          Ordered    ketorolac (TORADOL) 10 MG tablet  Every 6 hours PRN        03/11/23 0841    methocarbamol (ROBAXIN) 750 MG tablet  Every 6 hours PRN        03/11/23 0841              Durwin Glaze, MD 03/11/23 [phone]

## 2023-03-11 NOTE — Discharge Instructions (Addendum)
 Please take the medications as prescribed and follow-up with the orthopedist if needed if your symptoms or not improving the next few days.  You may also pick up some lidocaine patches from the pharmacy.  These are over-the-counter.  Try these in addition to the medications.  Also try a heating pad.  Return to the ER for worsening symptoms as we discussed.
